# Patient Record
Sex: Female | Born: 2000 | Race: White | Hispanic: No | Marital: Single
Health system: Southern US, Community
[De-identification: ages and names within clinical notes are randomized; demographics above are authoritative.]

## PROBLEM LIST (undated history)

## (undated) DIAGNOSIS — Z9889 Other specified postprocedural states: Secondary | ICD-10-CM

## (undated) DIAGNOSIS — R112 Nausea with vomiting, unspecified: Secondary | ICD-10-CM

## (undated) DIAGNOSIS — R519 Headache, unspecified: Secondary | ICD-10-CM

## (undated) DIAGNOSIS — R262 Difficulty in walking, not elsewhere classified: Secondary | ICD-10-CM

## (undated) DIAGNOSIS — J45909 Unspecified asthma, uncomplicated: Secondary | ICD-10-CM

## (undated) DIAGNOSIS — N946 Dysmenorrhea, unspecified: Secondary | ICD-10-CM

## (undated) HISTORY — PX: TONSILLECTOMY: SUR1361

## (undated) HISTORY — DX: Unspecified asthma, uncomplicated: J45.909

## (undated) HISTORY — DX: Headache, unspecified: R51.9

---
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
## 2023-02-10 ENCOUNTER — Emergency Department
Admission: EM | Admit: 2023-02-10 | Discharge: 2023-02-11 | Disposition: A | Discharge status: Home / Self Care | Payer: BC Managed Care – PPO | Attending: Emergency Medicine | Admitting: Emergency Medicine

## 2023-02-10 ENCOUNTER — Emergency Department: Payer: BC Managed Care – PPO

## 2023-02-10 DIAGNOSIS — X501XXA Overexertion from prolonged static or awkward postures, initial encounter: Secondary | ICD-10-CM | POA: Insufficient documentation

## 2023-02-10 DIAGNOSIS — Y9368 Activity, volleyball (beach) (court): Secondary | ICD-10-CM | POA: Insufficient documentation

## 2023-02-10 DIAGNOSIS — S8390XA Sprain of unspecified site of unspecified knee, initial encounter: Secondary | ICD-10-CM

## 2023-02-10 DIAGNOSIS — S8391XA Sprain of unspecified site of right knee, initial encounter: Secondary | ICD-10-CM | POA: Insufficient documentation

## 2023-02-10 NOTE — ED Provider Notes (Signed)
APP SUPERVISORY AND SHARED VISIT NOTE:  I personally saw the patient and made/approved the management plan and take responsibility for the patient management.    Exam: RLE: +edema of R knee, no deformity/ecchymosis, pain with flexion/extension, no laxity, R ankle: non-tender, +from    MDM: sprain vs fx vs dislocation    Plan: x-rays, meds, referral to ortho    Interpretations:   O2 sat-           saturation: 97 %; Oxygen use: room air; Interpretation: Normal    Radiology -    x-ray neg for fx as interpreted by me.    Splint Application    Date/Time: 02/11/2023 1:28 AM    Performed by: Alcide Evener, MD  Authorized by: Alcide Evener, MD    Consent:     Consent obtained:  Verbal    Consent given by:  Patient    Risks discussed:  Pain and swelling    Alternatives discussed:  Referral  Universal protocol:     Imaging studies available: yes      Site/side marked: yes      Patient identity confirmed:  Hospital-assigned identification number  Pre-procedure details:     Distal neurologic exam:  Normal    Distal perfusion: distal pulses strong    Procedure details:     Location:  Knee    Knee location:  R knee    Splint type:  Knee immobilizer  Post-procedure details:     Procedure completion:  Tolerated         I personally discussed the patient's management with the physician assistant.       Alcide Evener, MD  02/11/23 [phone]

## 2023-02-10 NOTE — Discharge Instructions (Signed)
Dear Rebekah Barrera:    Thank you for choosing the [facility] Emergency Department, the premier emergency department in the [place] area.  I hope your visit today was EXCELLENT. You will receive a survey via text message that will give you the opportunity to provide feedback to your team about your visit. Please do not hesitate to reach out with any questions!    Specific instructions for your visit today:    Rest no strenuous activity.  Whitney immobilizer for support however it is important to remove knee immobilizer and move knee is much as possible.  Use crutches to avoid weightbearing.  No sports or strenuous activity for at least 1 week.  Follow-up with orthopedic specialist for reevaluation.  Return if worsening pain or if any other concerns or questions    IF YOU DO NOT CONTINUE TO IMPROVE OR YOUR CONDITION WORSENS, PLEASE CONTACT YOUR DOCTOR OR RETURN IMMEDIATELY TO THE EMERGENCY DEPARTMENT.    Sincerely,  No att. providers found  Attending Emergency Physician  [facility] Emergency Department      OBTAINING A PRIMARY CARE APPOINTMENT    Primary care physicians (PCPs, also known as primary care doctors) are either internists or family medicine doctors. Both types of PCPs focus on health promotion, disease prevention, patient education and counseling, and treatment of acute and chronic medical conditions.    If you need a primary care doctor, please call the below number and ask who is receiving new patients.     [facility]  Telephone:  [phone]  [email or web address]    DOCTOR REFERRALS  Call [phone] (available 24 hours a day, 7 days a week) if you need any further referrals and we can help you find a primary care doctor or specialist.  Also, available online at:  [email or web address]    YOUR CONTACT INFORMATION  Before leaving please check with registration to make sure we have an up-to-date contact number.  You can call registration at [phone] to  update your information.  For questions about your hospital bill, please call [phone].  For questions about your Emergency Dept Physician bill please call [phone].      FREE HEALTH SERVICES  If you need help with health or social services, please call 2-1-1 for a free referral to resources in your area.  2-1-1 is a free service connecting people with information on health insurance, free clinics, pregnancy, mental health, dental care, food assistance, housing, and substance abuse counseling.  Also, available online at:  http://www.[facility].org    ORTHOPEDIC INJURY   Please know that significant injuries can exist even when an initial x-ray is read as normal or negative.  This can occur because some fractures (broken bones) are not initially visible on x-rays.  For this reason, close outpatient follow-up with your primary care doctor or bone specialist (orthopedist) is required.    MEDICATIONS AND FOLLOWUP  Please be aware that some prescription medications can cause drowsiness.  Use caution when driving or operating machinery.    The examination and treatment you have received in our Emergency Department is provided on an emergency basis, and is not intended to be a substitute for your primary care physician.  It is important that your doctor checks you again and that you report any new or remaining problems at that time.      ASSISTANCE WITH INSURANCE    Affordable Care Act  [facility])  Call to start or finish an application, compare plans, enroll or ask a question.  [phone]  TTY: ([phone]  Web:  Healthcare.gov    Help Enrolling in [facility]  [employer] [place]  (878) 767-9076 (TOLL-FREE)  ([phone] 634-3730 (TTY)  Web:  Http://www.coverva.org    Local Help Enrolling in the [facility]  [place] [facility]  734-010-6298 (MAIN)  Email:  health-help@nvfs .org  Web:  BlackjackMyths.is  Address:  [address] [place], [place] [address]    SEDATING MEDICATIONS  Sedating medications  include strong pain medications (e.g. narcotics), muscle relaxers, benzodiazepines (used for anxiety and as muscle relaxers), Benadryl/diphenhydramine and other antihistamines for allergic reactions/itching, and other medications.  If you are unsure if you have received a sedating medication, please ask your physician or nurse.  If you received a sedating medication: DO NOT drive a car. DO NOT operate machinery. DO NOT perform jobs where you need to be alert.  DO NOT drink alcoholic beverages while taking this medicine.     If you get dizzy, sit or lie down at the first signs. Be careful going up and down stairs.  Be extra careful to prevent falls.     Never give this medicine to others.     Keep this medicine out of reach of children.     Do not take or save old medicines. Throw them away when outdated.     Keep all medicines in a cool, dry place. DO NOT keep them in your bathroom medicine cabinet or in a cabinet above the stove.    MEDICATION REFILLS  Please be aware that we cannot refill any prescriptions through the ER. If you need further treatment from what is provided at your ER visit, please follow up with your primary care doctor or your pain management specialist.    FREESTANDING EMERGENCY DEPARTMENTS OF [facility]  Did you know [facility] has two freestanding ERs located just a few miles away?  [facility] [place] and [facility] [place] have short wait times, easy free parking directly in front of the building and top patient satisfaction scores - and the same Board Certified Emergency Medicine doctors as [facility].

## 2023-02-10 NOTE — ED Triage Notes (Signed)
Patient reports having right knee twisted and felt excruciating pain to medial aspect of the right knee. Pain shot through the leg. Difficulty to weight bear. Similar pain when she injured left knee and torn ACL. Been putting ice to area. Denies injury or trauma to area. A&Ox4, breathing stable.

## 2023-02-10 NOTE — ED Provider Notes (Signed)
History     Chief Complaint   Patient presents with    Knee Injury     Patient with history of meniscal tears on bilateral knees history of ACL repair on the left knee.  Patient is a Horticulturist, commercial and a Customer service manager.  Today during volleyball game right foot was planted and knee shifted from side-to-side.  Sharp pain.  Unable to continue playing.  Complains of moderate pain.  No numbness tingling.  Notes pain similar to previous episode of ACL tear on the opposite side.  Past medical history: Asthma  Social history: No tobacco alcohol or drug use    The history is provided by the patient. No language interpreter was used.        Medical History[1]    Past Surgical History[2]    Family History[3]    Social  Social History[4]    .     Allergies[5]    Home Medications               APRI 0.15-30 MG-MCG per tablet     TAKE 1 TABLET BY MOUTH DAILY.     aspirin-acetaminophen-caffeine (EXCEDRIN MIGRAINE) 250-250-65 MG per tablet     Take 1 tablet by mouth every 6 (six) hours as needed.             Review of Systems   All other systems reviewed and are negative.      Physical Exam    BP: 134/83, Heart Rate: 89, Temp: 98.7 F (37.1 C), Resp Rate: 18, SpO2: 97 %, Weight: 90.7 kg    Physical Exam  Vitals and nursing note reviewed.   Constitutional:       General: She is not in acute distress.     Appearance: She is well-developed. She is not diaphoretic.   HENT:      Head: Normocephalic and atraumatic.      Right Ear: External ear normal.      Left Ear: External ear normal.      Nose: Nose normal.   Eyes:      General:         Right eye: No discharge.         Left eye: No discharge.      Conjunctiva/sclera: Conjunctivae normal.      Pupils: Pupils are equal, round, and reactive to light.   Neck:      Trachea: Phonation normal. No tracheal deviation.   Cardiovascular:      Rate and Rhythm: Normal rate and regular rhythm.      Pulses:           Radial pulses are 2+ on the right side and 2+ on the left side.   Pulmonary:       Effort: Pulmonary effort is normal. No respiratory distress.   Musculoskeletal:         General: Normal range of motion.      Cervical back: Normal range of motion and neck supple.      Comments: Right knee: No deformity, no visible swelling, diffuse tenderness along lateral knee.  Knee stable on valgus and varus force.  And anterior posterior drawer negative.  Extension intact.  Full range of motion but guarded due to pain.   Skin:     General: Skin is warm and dry.   Neurological:      Mental Status: She is alert and oriented to person, place, and time.      GCS: GCS eye subscore is 4.  GCS verbal subscore is 5. GCS motor subscore is 6.      Cranial Nerves: No cranial nerve deficit.   Psychiatric:         Speech: Speech normal.           MDM and ED Course     ED Medication Orders (From admission, onward)      None               Medical Decision Making  Patient presents with acute onset of right knee pain during a volleyball game.  Sharp pain.  History of prior knee injuries including ACL tear.  Knee stable on exam.  Requesting an MRI.  Spoke to patient at length.  Patient to follow-up with orthopedic specialist.  Knee immobilizer crutches given.      ----------------------------PROCEDURE: SPLINT APPLICATION-----------------------------------  Applied by tech, supervised by the emergency provider.  Location:R knee  Procedure: The area of the splint was appropriately positioned. A knee immobilizer and crutches was applied for definitive management.   Post-procedure: Good position. Neurovascular status remains intact. Patient tolerated the procedure well with no immediate complications.        Amount and/or Complexity of Data Reviewed  Radiology: ordered. Decision-making details documented in ED Course.     Details: Reviewed x-rays                     Procedures    Clinical Impression & Disposition     Clinical Impression  Final diagnoses:   Sprain of knee, unspecified laterality, unspecified ligament, initial  encounter        ED Disposition       None             New Prescriptions    No medications on file                     [1]   Past Medical History:  Diagnosis Date    Asthma     exercise induced as a child, no recent issues. no inhaler in years    Difficulty walking     limps    Headache     Post-operative nausea and vomiting     Grandmother   [2]   Past Surgical History:  Procedure Laterality Date    ARTHROSCOPIC KNEE, ACL RECONSTRUCTION Left 09/30/2017    Procedure: ARTHROSCOPIC KNEE, ACL RECONSTRUCTION;  Surgeon: Valera Castle, MD;  Location: [facility] OR;  Service: Orthopedics;  Laterality: Left;  LEFT KNEE ARTHROSCOPY, ACL RECONSTRUCTION W/BONE-PATTELLAR-BONE AUTOGRAFT; PARTIAL LATERAL MENISECTOMY    tonsillectomy and adnoids removed  2008   [3]   Family History  Problem Relation Name Age of Onset    No known problems Mother      No known problems Father     [4]   Social History  Tobacco Use    Smoking status: Never    Smokeless tobacco: Never   Vaping Use    Vaping status: Never Used   Substance Use Topics    Alcohol use: No    Drug use: No   [5] No Known Allergies       Tommi Rumps, PA  02/12/23 0410

## 2023-02-11 ENCOUNTER — Encounter (INDEPENDENT_AMBULATORY_CARE_PROVIDER_SITE_OTHER): Payer: Self-pay

## 2023-02-11 ENCOUNTER — Other Ambulatory Visit: Payer: Self-pay | Admitting: Nurse Practitioner

## 2023-02-11 DIAGNOSIS — M25561 Pain in right knee: Secondary | ICD-10-CM

## 2023-02-11 NOTE — ED Notes (Signed)
Knee immobilizer applied and educated on crutch use.

## 2023-02-12 ENCOUNTER — Ambulatory Visit (INDEPENDENT_AMBULATORY_CARE_PROVIDER_SITE_OTHER): Payer: BC Managed Care – PPO | Admitting: Sports Medicine

## 2023-02-12 ENCOUNTER — Encounter (INDEPENDENT_AMBULATORY_CARE_PROVIDER_SITE_OTHER): Payer: Self-pay | Admitting: Sports Medicine

## 2023-02-12 VITALS — Ht 69.0 in | Wt 200.0 lb

## 2023-02-12 DIAGNOSIS — M25561 Pain in right knee: Secondary | ICD-10-CM

## 2023-02-12 NOTE — Progress Notes (Signed)
[facility]    Provider: Valera Castle, MD  Date of Exam:  02/12/2023   Patient:  Rebekah Barrera  DOB:  30-May-2000    AGE:  22 y.o.  MR#:  [id number]     Chief Complaint: Right knee pain.    HPI:  Rebekah Barrera is a pleasant 22 y.o.-year-old female s/p L knee ACL reconstruction who presents today with a history of right knee pain that she has had for the past 2 days after a volleyball injury where she landed and felt her knee shift. She localizes the pain to the lateral knee. She reports she had immediate pain and swelling and was unable to bear weight on the right leg. She went to the ED,had negative xrays of the right knee  and was placed in an immobilizer. She reports she is feeling much better now and has no issues with ambulation, swelling resolved. She reports painless clicking at the knee. She reports swelling and stiffness post activities. She denies mechanical symptoms such as catching or locking. Rebekah Barrera denies a sense of instability.  She has no significant history of prior injury. She has attempted modifcation of her activities with little improvement in her symptoms. Her PCP has ordered her a right knee MRI which she has scheduled for next Friday. She has attempted 0 days of formal physical therapy.  Rebekah Barrera has been kindly referred to our clinic for further evaluation and discussion of treatment options.Rebekah Barrera is now here for further evaluation and discussion of treatment options.    For other past medical history, social history, family history and past surgical history please see them listed below.    Club/School/Work Affiliation: volleyball, dance     Problem List: Problem List[1]     Past Medical History: Medical History[2]    Social History: Social History[3]    Family History: Family History[4]    Past Surgical History: Past Surgical History[5]    Medications: Current Medications[6]    Allergies: Allergies[7]    ROS:  Constitutional: No fatigue, fever, weight loss, or weight gain.   Ears, Nose,  Mouth & Throat: No sore throat or hearing loss.   Cardiovascular: No chest pain, blood clots, or leg cramps.   Respiratory: No shortness of breath, cough, or difficulty breathing.   Gastrointestinal: No nausea, vomiting, diarrhea, or loss of appetite.   Genitourinary: No polyuria or kidney disease.   Musculoskeletal: No joint aches, muscle weakness or swelling of joints/body parts other than that mentioned above/below.  Integumentary: No finger nail changes or skin dryness.   Neurological: No numbness, burning discomfort, or headaches.   Psychiatric: No depression or anxiety.   Endocrine: No increased thirst, change in appetite or thyroid disease.   Hematologic/Lymphatic: No easy bruising or anemia.     EXAM:    Right Knee Exam:  Skin intact  Erythema: None present  Swelling: None present  Effusion: Present  Deformities: None  ROM: 0-135  Patellofemoral Crepitus: Negative  Patellar Apprehension at 30 deg: Negative  J Sign: Negative  Lachman 1A  Pivot Shift: Negative   Anterior Drawer: WNL  Posterior drawer: WNL  Dial Test at 30 WNL  at 90 WNL  Varus at 0 WNL at 30 WNL  Valgus at 0 WNL at 30 WNL  Medial McMurray's: Negative  Lateral McMurray's: Negative  Medial Joint Line TTP: Negative  Lateral Joint Line TTP: Positive  Strength: WNL    Other areas of Tenderness: None  DTR and Pathological Reflexes Intact  No  lymphadenopathy  Sensation Intact to light touch all distributions of leg and foot  DF, PF, EHL motor intact  Foot Perfused with CR <2 sec  DP/PT pulse 2+    Left Knee Exam:  Skin intact  Erythema: None present  Swelling: None present  Effusion: None  Deformities: None  ROM: 0-135  Patellofemoral Crepitus: Negative  Patellar Apprehension at 30 deg: Negative  J Sign: Negative  Lachman 1A  Pivot Shift: Negative   Anterior Drawer: WNL  Posterior drawer: WNL  Dial Test at 30 WNL  at 90 WNL  Varus at 0 WNL at 30 WNL  Valgus at 0 WNL at 30 WNL  Medial McMurray's: Negative  Lateral McMurray's: Negative  Medial Joint  Line TTP: None  Lateral Joint Line TTP: None  Strength: WNL    Other areas of Tenderness: None  DTR and Pathological Reflexes Intact  No lymphadenopathy  Sensation Intact to light touch all distributions of leg and foot  DF, PF, EHL motor intact  Foot Perfused with CR <2 sec  DP/PT pulse 2+    Vitals: Ht 1.753 m (5\' 9" )   Wt 90.7 kg (200 lb)   LMP 02/03/2023   BMI 29.53 kg/m     General: Rebekah Barrera was awake, alert, oriented, easily engaged, displayed logical thinking with clear speech and was neat in appearance. Her general appearance was normal, well-developed and well-nourished.    Gait: The patient demonstrated non antalgic gait with intact coordination and balance.     Studies:     Knee 4+ View Right [[id number]] (Order [id number])  Status: Final result     Study Result    Narrative & Impression   HISTORY: Pain of the right knee.     COMPARISON: 01/23/2016     FINDINGS:      No acute fracture or dislocation.     No focal soft tissue swelling.     No suprapatellar joint effusion.     IMPRESSION:      No acute abnormality.     Rebekah Isaacs, MD  02/11/2023 12:53 AM         Assessment/Plan: Rebekah Barrera is a pleasant 22 y.o. female with historical and clinical evidence of right knee injury, possible lateral meniscus injury.  The patient's diagnosis and treatment options were discussed in detail at today's visit. I have recommended an MRI for further evaluation and diagnosis.The patient will follow up in our clinic for review of the MRI at the next earliest available. I have taken this opportunity to refer the patient to formal physical therapy. I have asked the patient to avoid high impact activities and deep knee bent positions if possible.  We have discussed the potential need for surgical intervention. We will plan on follow up after the MRI.  The patient will notify my clinic of any changes or worsening of their symptoms during the interim.    Elements of MDM:   Based on the below 2023 Evaluation and Management Time  Requirements, the appropriate level of service is [phone] based on the 46 minutes spent face-to-face with the patient along with the non-face-to-face time spent by me performing the following highlighted activities:      Preparing for the visit (such as reviewing tests)  Getting and/or reviewing a history that was separately obtained  Performing the exam  Counseling and providing education to the patient, family, or caregiver  Documenting information in the medical record  Interpreting results and sharing that information with the patient, family or caregiver  Ordering medicines, tests, or procedures  Communicating with other healthcare professionals  Care coordination             The review of the patient's medications does not in any way constitute an endorsement, by this clinician,  of their use, dosage, indications, route, efficacy, interactions, or other clinical parameters.    This note may have been generated in part or in whole within the [employer] EMR using Dragon medical speech recognition software and may contain inherent errors or omissions not intended by the user. Grammatical and punctuation errors, random word insertions, deletions, pronoun errors and incomplete sentences are occasional consequences of this technology due to software limitations. Not all errors are caught or corrected.  Although every attempt is made to root out erroneus and incomplete transcription, the note may still not fully represent the intent or opinion of the author. If there are questions or concerns about the content of this note or information contained within the body of this dictation they should be addressed directly with the author for clarification.      I, Wynelle Beckmann, PA-C personally provided scribing services for Burnard Bunting, MD for patient SHELBYLYNN WALCZYK on February 12, 2023 .     Melody Burnetta Sabin, [place], acted as a Neurosurgeon for this encounter for Dr. Arsenio Loader. I have reviewed and edited this note when appropriate and agree with  the documentation            [1]   Patient Active Problem List  Diagnosis    Anterior cruciate ligament disruption, left, initial encounter    Complex tear of medial meniscus of left knee, initial encounter    Tear of lateral cartilage or meniscus of knee, current   [2]   Past Medical History:  Diagnosis Date    Asthma     exercise induced as a child, no recent issues. no inhaler in years    Difficulty walking     limps    Headache     Post-operative nausea and vomiting     Grandmother   [3]   Social History  Tobacco Use    Smoking status: Never    Smokeless tobacco: Never   Vaping Use    Vaping status: Never Used   Substance Use Topics    Alcohol use: No    Drug use: No   [4]   Family History  Problem Relation Name Age of Onset    No known problems Mother      No known problems Father     [5]   Past Surgical History:  Procedure Laterality Date    ARTHROSCOPIC KNEE, ACL RECONSTRUCTION Left 09/30/2017    Procedure: ARTHROSCOPIC KNEE, ACL RECONSTRUCTION;  Surgeon: Valera Castle, MD;  Location: [facility];  Service: Orthopedics;  Laterality: Left;  LEFT KNEE ARTHROSCOPY, ACL RECONSTRUCTION W/BONE-PATTELLAR-BONE AUTOGRAFT; PARTIAL LATERAL MENISECTOMY    tonsillectomy and adnoids removed  2008   [6]   Current Outpatient Medications:     APRI 0.15-30 MG-MCG per tablet, TAKE 1 TABLET BY MOUTH DAILY., Disp: , Rfl: 1    aspirin-acetaminophen-caffeine (EXCEDRIN MIGRAINE) 250-250-65 MG per tablet, Take 1 tablet by mouth every 6 (six) hours as needed, Disp: , Rfl:     cetirizine (ZyrTEC) 10 MG chewable tablet, Chew 1 tablet (10 mg) by mouth daily, Disp: , Rfl:   [7] No Known Allergies

## 2023-02-15 ENCOUNTER — Other Ambulatory Visit: Payer: Self-pay | Admitting: Nurse Practitioner

## 2023-02-15 DIAGNOSIS — M25561 Pain in right knee: Secondary | ICD-10-CM

## 2023-02-17 ENCOUNTER — Encounter (INDEPENDENT_AMBULATORY_CARE_PROVIDER_SITE_OTHER): Payer: Self-pay | Admitting: Sports Medicine

## 2023-02-17 ENCOUNTER — Ambulatory Visit (INDEPENDENT_AMBULATORY_CARE_PROVIDER_SITE_OTHER): Payer: BC Managed Care – PPO | Admitting: Sports Medicine

## 2023-02-17 ENCOUNTER — Ambulatory Visit: Payer: BC Managed Care – PPO | Attending: Nurse Practitioner

## 2023-02-17 VITALS — BP 137/77 | HR 76

## 2023-02-17 DIAGNOSIS — M25561 Pain in right knee: Secondary | ICD-10-CM | POA: Insufficient documentation

## 2023-02-17 DIAGNOSIS — M94261 Chondromalacia, right knee: Secondary | ICD-10-CM

## 2023-02-17 DIAGNOSIS — S83281A Other tear of lateral meniscus, current injury, right knee, initial encounter: Secondary | ICD-10-CM

## 2023-02-17 NOTE — Progress Notes (Signed)
[facility]    Provider: Valera Castle, MD  Date of Exam:  02/17/2023   Patient:  Rebekah Barrera  DOB:  [birth year]    AGE:  22 y.o.  MR#:  [id number]     Chief Complaint: Right knee pain.    HPI:  Rebekah Barrera is a pleasant 22 y.o.-year-old female s/p L knee ACL reconstruction who presents today with a history of right knee pain that she has had for the past 2 days after a volleyball injury where she landed and felt her knee shift. She localizes the pain to the lateral knee. She reports she had immediate pain and swelling and was unable to bear weight on the right leg. She went to the ED,had negative xrays of the right knee  and was placed in an immobilizer. She reports she is feeling much better now and has no issues with ambulation, swelling resolved. She reports painless clicking at the knee. She reports swelling and stiffness post activities. She denies mechanical symptoms such as catching or locking. Rebekah Barrera denies a sense of instability.  She has no significant history of prior injury. She has attempted modifcation of her activities with little improvement in her symptoms. Her PCP has ordered her a right knee MRI which she has scheduled for next Friday. She has attempted 0 days of formal physical therapy.  Rebekah Barrera has been kindly referred to our clinic for further evaluation and discussion of treatment options.Rebekah Barrera is now here for further evaluation and discussion of treatment options.      HPI update 02/17/23:  Patient presents today for MRI review.     For other past medical history, social history, family history and past surgical history please see them listed below.    Club/School/Work Affiliation: volleyball, dance     Problem List: Problem List[1]     Past Medical History: Medical History[2]    Social History: Social History[3]    Family History: Family History[4]    Past Surgical History: Past Surgical History[5]    Medications: Current Medications[6]    Allergies:  Allergies[7]    ROS:  Constitutional: No fatigue, fever, weight loss, or weight gain.   Ears, Nose, Mouth & Throat: No sore throat or hearing loss.   Cardiovascular: No chest pain, blood clots, or leg cramps.   Respiratory: No shortness of breath, cough, or difficulty breathing.   Gastrointestinal: No nausea, vomiting, diarrhea, or loss of appetite.   Genitourinary: No polyuria or kidney disease.   Musculoskeletal: No joint aches, muscle weakness or swelling of joints/body parts other than that mentioned above/below.  Integumentary: No finger nail changes or skin dryness.   Neurological: No numbness, burning discomfort, or headaches.   Psychiatric: No depression or anxiety.   Endocrine: No increased thirst, change in appetite or thyroid disease.   Hematologic/Lymphatic: No easy bruising or anemia.     EXAM:    Right Knee Exam:  Skin intact  Erythema: None present  Swelling: None present  Effusion: Present  Deformities: None  ROM: 0-135  Patellofemoral Crepitus: Negative  Patellar Apprehension at 30 deg: Negative  J Sign: Negative  Lachman 1A  Pivot Shift: Negative   Anterior Drawer: WNL  Posterior drawer: WNL  Dial Test at 30 WNL  at 90 WNL  Varus at 0 WNL at 30 WNL  Valgus at 0 WNL at 30 WNL  Medial McMurray's: Negative  Lateral McMurray's: Negative  Medial Joint Line TTP: Negative  Lateral Joint Line TTP: Positive  Strength: WNL  Other areas of Tenderness: None  DTR and Pathological Reflexes Intact  No lymphadenopathy  Sensation Intact to light touch all distributions of leg and foot  DF, PF, EHL motor intact  Foot Perfused with CR <2 sec  DP/PT pulse 2+    Left Knee Exam:  Skin intact  Erythema: None present  Swelling: None present  Effusion: None  Deformities: None  ROM: 0-135  Patellofemoral Crepitus: Negative  Patellar Apprehension at 30 deg: Negative  J Sign: Negative  Lachman 1A  Pivot Shift: Negative   Anterior Drawer: WNL  Posterior drawer: WNL  Dial Test at 30 WNL  at 90 WNL  Varus at 0 WNL at 30  WNL  Valgus at 0 WNL at 30 WNL  Medial McMurray's: Negative  Lateral McMurray's: Negative  Medial Joint Line TTP: None  Lateral Joint Line TTP: None  Strength: WNL    Other areas of Tenderness: None  DTR and Pathological Reflexes Intact  No lymphadenopathy  Sensation Intact to light touch all distributions of leg and foot  DF, PF, EHL motor intact  Foot Perfused with CR <2 sec  DP/PT pulse 2+    Vitals: BP 137/77   Pulse 76   LMP 02/03/2023     General: Rebekah Barrera was awake, alert, oriented, easily engaged, displayed logical thinking with clear speech and was neat in appearance. Her general appearance was normal, well-developed and well-nourished.    Gait: The patient demonstrated non antalgic gait with intact coordination and balance.     Studies:     Knee 4+ View Right [[id number]] (Order [id number])  Status: Final result     Study Result    Narrative & Impression   HISTORY: Pain of the right knee.     COMPARISON: 01/23/2016     FINDINGS:      No acute fracture or dislocation.     No focal soft tissue swelling.     No suprapatellar joint effusion.     IMPRESSION:      No acute abnormality.     Rebekah Isaacs, MD  02/11/2023 12:53 AM     MRI Knee Right  WO Contrast [[id number]] (Order [id number])  Status: Final result     Study Result    Narrative & Impression   HISTORY: Right knee pain     COMPARISON: X-rays taken on 02/10/2023.     TECHNIQUE: MRI of the right knee was performed on a 3T scanner without  intravenous contrast.     FINDINGS:  Menisci: There is longitudinal horizontal oblique tearing at the posterior  horn of the lateral meniscus. The medial meniscus remains intact.     Ligaments: The ACL and PCL are intact. The collateral ligaments appear  normal.     Muscles/Tendons: The extensor mechanism is intact. No abnormal muscle edema  or atrophy is seen.     Cartilage: There is chondral delamination and flap formation at the central  weightbearing regions of the lateral femoral condyle; the meniscal flap  spans approximately  9 mm x 4 mm. No other focal defects are identified.     Bone: Subchondral stress reaction seen at the medial femoral condyle in the  region of the aforementioned chondral defect.     Joint: There is a moderate-sized joint effusion. No loose body is  identified.     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  2.Moderate size joint effusion     Rebekah Feil, MD  02/17/2023 9:23 AM       Assessment/Plan: Rebekah Barrera is a pleasant 22 y.o. female with historical and clinical evidence of right knee injury lateral meniscal tear with chondral defect lateral femoral condyle with some bone marrow edema.  The patient's diagnosis and treatment options were discussed in detail at today's visit. I have had a discussion today with her regarding diagnosis.  Because she is on that the 1 her main activities are findings I do recommend right knee arthroscopy partial lateral meniscectomy versus repair and debridement of chondral lesion.  We talked about the options for the chondral lesion to include everything from simple chondroplasty debridement microfracture with bio cartilage to bigger procedures like oats.  After explaining risks and benefits of each they elected to proceed but do not want to proceed with any osteochondral allografting I did explain to her this is most likely be done in setting of a salvage procedure.  She understands that.  Risk benefits were explained.  In send risk benefits and like to proceed.  I have asked the patient to avoid high impact activities and deep knee bent positions if possible.  We have discussed the potential need for surgical intervention. We will plan on follow up for the surgery.  A brace was given today..  The patient will notify my clinic of any changes or worsening of their symptoms during the interim.    Elements of MDM:   Based on the below 2023  Evaluation and Management Time Requirements, the appropriate level of service is [phone] based on the 41 minutes spent face-to-face with the patient along with the non-face-to-face time spent by me performing the following highlighted activities:      Preparing for the visit (such as reviewing tests)  Getting and/or reviewing a history that was separately obtained  Performing the exam  Counseling and providing education to the patient, family, or caregiver  Documenting information in the medical record  Interpreting results and sharing that information with the patient, family or caregiver  Reviewing the risks of surgery   Surgical scheduling, coding and posting  Planning post-operative care (arranging follow up visits, PT prescriptions, ice machine, bracing, reviewing protocols, work notes)  Ordering medicines, tests, or procedures  Communicating with other healthcare professionals  Care coordination             I, Rebekah Burnetta Sabin, PA-C personally provided scribing services for Burnard Bunting, MD for patient MANAAL MANDALA on February 17, 2023 .     Rebekah Barrera, [place], acted as a Neurosurgeon for this encounter for Dr. Arsenio Loader. I have reviewed and edited this note when appropriate and agree with the documentation            [1]   Patient Active Problem List  Diagnosis    Anterior cruciate ligament disruption, left, initial encounter    Complex tear of medial meniscus of left knee, initial encounter    Tear of lateral cartilage or meniscus of knee, current   [2]   Past Medical History:  Diagnosis Date    Asthma     exercise induced as a child, no recent issues. no inhaler in years    Difficulty walking     limps    Headache     Post-operative nausea and vomiting     Grandmother   [3]   Social History  Tobacco Use    Smoking status: Never  Smokeless tobacco: Never   Vaping Use    Vaping status: Never Used   Substance Use Topics    Alcohol use: No    Drug use: No   [4]   Family History  Problem Relation Name Age of Onset    No  known problems Mother      No known problems Father     [5]   Past Surgical History:  Procedure Laterality Date    ARTHROSCOPIC KNEE, ACL RECONSTRUCTION Left 09/30/2017    Procedure: ARTHROSCOPIC KNEE, ACL RECONSTRUCTION;  Surgeon: Valera Castle, MD;  Location: [facility];  Service: Orthopedics;  Laterality: Left;  LEFT KNEE ARTHROSCOPY, ACL RECONSTRUCTION W/BONE-PATTELLAR-BONE AUTOGRAFT; PARTIAL LATERAL MENISECTOMY    tonsillectomy and adnoids removed  2008   [6]   Current Outpatient Medications:     aspirin-acetaminophen-caffeine (EXCEDRIN MIGRAINE) 250-250-65 MG per tablet, Take 1 tablet by mouth every 6 (six) hours as needed, Disp: , Rfl:     APRI 0.15-30 MG-MCG per tablet, TAKE 1 TABLET BY MOUTH DAILY. (Patient not taking: Reported on 02/17/2023), Disp: , Rfl: 1    cetirizine (ZyrTEC) 10 MG chewable tablet, Chew 1 tablet (10 mg) by mouth daily (Patient not taking: Reported on 02/17/2023), Disp: , Rfl:   [7] No Known Allergies

## 2023-02-18 ENCOUNTER — Encounter (INDEPENDENT_AMBULATORY_CARE_PROVIDER_SITE_OTHER): Payer: Self-pay | Admitting: Sports Medicine

## 2023-02-19 ENCOUNTER — Ambulatory Visit: Payer: BC Managed Care – PPO

## 2023-02-22 ENCOUNTER — Ambulatory Visit (INDEPENDENT_AMBULATORY_CARE_PROVIDER_SITE_OTHER): Payer: BC Managed Care – PPO | Admitting: Sports Medicine

## 2023-02-23 ENCOUNTER — Encounter (INDEPENDENT_AMBULATORY_CARE_PROVIDER_SITE_OTHER): Payer: Self-pay

## 2023-03-05 ENCOUNTER — Encounter (INDEPENDENT_AMBULATORY_CARE_PROVIDER_SITE_OTHER): Payer: Self-pay

## 2023-03-09 ENCOUNTER — Encounter (INDEPENDENT_AMBULATORY_CARE_PROVIDER_SITE_OTHER): Payer: Self-pay | Admitting: Sports Medicine

## 2023-03-09 ENCOUNTER — Ambulatory Visit (AMBULATORY_SURGERY_CENTER): Payer: BC Managed Care – PPO | Admitting: Sports Medicine

## 2023-03-09 ENCOUNTER — Encounter (INDEPENDENT_AMBULATORY_CARE_PROVIDER_SITE_OTHER): Payer: Self-pay

## 2023-03-09 DIAGNOSIS — M958 Other specified acquired deformities of musculoskeletal system: Secondary | ICD-10-CM

## 2023-03-09 DIAGNOSIS — S83281A Other tear of lateral meniscus, current injury, right knee, initial encounter: Secondary | ICD-10-CM

## 2023-03-09 MED ORDER — ONDANSETRON HCL 4 MG PO TABS
4.0000 mg | ORAL_TABLET | Freq: Three times a day (TID) | ORAL | 0 refills | Status: DC | PRN
Start: 2023-03-09 — End: 2023-06-04

## 2023-03-09 MED ORDER — OXYCODONE HCL 5 MG PO TABS
5.0000 mg | ORAL_TABLET | ORAL | 0 refills | Status: AC | PRN
Start: 2023-03-09 — End: 2023-03-21

## 2023-03-10 ENCOUNTER — Encounter (INDEPENDENT_AMBULATORY_CARE_PROVIDER_SITE_OTHER): Payer: Self-pay | Admitting: Sports Medicine

## 2023-03-13 ENCOUNTER — Encounter (INDEPENDENT_AMBULATORY_CARE_PROVIDER_SITE_OTHER): Payer: Self-pay | Admitting: Sports Medicine

## 2023-03-17 ENCOUNTER — Inpatient Hospital Stay: Payer: BC Managed Care – PPO

## 2023-03-19 ENCOUNTER — Encounter: Payer: Self-pay | Admitting: Rehabilitative and Restorative Service Providers"

## 2023-03-19 ENCOUNTER — Inpatient Hospital Stay: Payer: BC Managed Care – PPO | Attending: Sports Medicine | Admitting: Rehabilitative and Restorative Service Providers"

## 2023-03-19 VITALS — BP 135/87

## 2023-03-19 DIAGNOSIS — M25561 Pain in right knee: Secondary | ICD-10-CM | POA: Insufficient documentation

## 2023-03-19 DIAGNOSIS — R262 Difficulty in walking, not elsewhere classified: Secondary | ICD-10-CM | POA: Insufficient documentation

## 2023-03-19 NOTE — Progress Notes (Signed)
 Name:Rebekah Barrera Age: 22 y.o.   Date of Service: 03/19/2023  Referring Physician: Valera Castle, MD   Date of Injury: No data found 03/18/2023  PT Date Care Plan Established/Reviewed:03/19/2023  PT Date Treatment Started:03/19/2023  OT Date Care Plan

## 2023-03-23 ENCOUNTER — Telehealth (INDEPENDENT_AMBULATORY_CARE_PROVIDER_SITE_OTHER): Payer: Self-pay

## 2023-03-23 ENCOUNTER — Inpatient Hospital Stay: Payer: BC Managed Care – PPO | Admitting: Rehabilitative and Restorative Service Providers"

## 2023-03-23 NOTE — Telephone Encounter (Signed)
Op report sent to [facility] at [phone].

## 2023-03-24 ENCOUNTER — Ambulatory Visit (INDEPENDENT_AMBULATORY_CARE_PROVIDER_SITE_OTHER): Payer: BC Managed Care – PPO | Admitting: Sports Medicine

## 2023-03-24 ENCOUNTER — Encounter (INDEPENDENT_AMBULATORY_CARE_PROVIDER_SITE_OTHER): Payer: Self-pay | Admitting: Sports Medicine

## 2023-03-24 VITALS — BP 127/80 | HR 81 | Ht 69.0 in | Wt 200.0 lb

## 2023-03-24 DIAGNOSIS — Z9889 Other specified postprocedural states: Secondary | ICD-10-CM

## 2023-03-24 NOTE — Progress Notes (Signed)
 [facility]    Provider: Valera Castle, MD  Date of Exam:  03/24/2023   Patient:  Rebekah Barrera  DOB:  2000-08-15    AGE:  22 y.o.  MR#:  [id number]     Diagnosis: s/p right knee arthroscopy with partial lateral menisectomy, chondroplasty and mic

## 2023-03-26 ENCOUNTER — Inpatient Hospital Stay: Payer: BC Managed Care – PPO | Admitting: Rehabilitative and Restorative Service Providers"

## 2023-03-30 ENCOUNTER — Inpatient Hospital Stay: Payer: BC Managed Care – PPO | Admitting: Rehabilitative and Restorative Service Providers"

## 2023-04-06 ENCOUNTER — Inpatient Hospital Stay: Payer: BC Managed Care – PPO | Admitting: Rehabilitative and Restorative Service Providers"

## 2023-04-09 ENCOUNTER — Inpatient Hospital Stay: Payer: BC Managed Care – PPO | Admitting: Rehabilitative and Restorative Service Providers"

## 2023-04-13 ENCOUNTER — Inpatient Hospital Stay: Payer: BC Managed Care – PPO

## 2023-04-16 ENCOUNTER — Inpatient Hospital Stay: Payer: BC Managed Care – PPO

## 2023-04-20 ENCOUNTER — Inpatient Hospital Stay: Payer: BC Managed Care – PPO | Admitting: Rehabilitative and Restorative Service Providers"

## 2023-04-21 ENCOUNTER — Ambulatory Visit (INDEPENDENT_AMBULATORY_CARE_PROVIDER_SITE_OTHER): Payer: BC Managed Care – PPO | Admitting: Sports Medicine

## 2023-04-21 VITALS — BP 129/79 | HR 86

## 2023-04-21 DIAGNOSIS — M25561 Pain in right knee: Secondary | ICD-10-CM

## 2023-04-21 NOTE — Progress Notes (Signed)
 [facility]    Provider: Valera Castle, MD  Date of Exam:  04/21/2023   Patient:  Rebekah Barrera  DOB:  Jan 26, 2001    AGE:  22 y.o.  MR#:  [id number]     Diagnosis: s/p right knee arthroscopy with partial lateral menisectomy, chondroplasty and mic

## 2023-04-30 ENCOUNTER — Ambulatory Visit (INDEPENDENT_AMBULATORY_CARE_PROVIDER_SITE_OTHER): Payer: BC Managed Care – PPO | Admitting: Sports Medicine

## 2023-06-04 ENCOUNTER — Ambulatory Visit (INDEPENDENT_AMBULATORY_CARE_PROVIDER_SITE_OTHER): Payer: BC Managed Care – PPO | Admitting: Sports Medicine

## 2023-06-04 ENCOUNTER — Encounter (INDEPENDENT_AMBULATORY_CARE_PROVIDER_SITE_OTHER): Payer: Self-pay | Admitting: Sports Medicine

## 2023-06-04 VITALS — Ht 69.0 in | Wt 200.0 lb

## 2023-06-04 DIAGNOSIS — Z9889 Other specified postprocedural states: Secondary | ICD-10-CM

## 2023-06-04 NOTE — Progress Notes (Signed)
 [facility]    Provider: Lamar KANDICE Riches, MD  Date of Exam:  06/04/2023   Patient:  Rebekah Barrera  DOB:  July 31, 2000    AGE:  23 y.o.  MR#:  [id number]     Diagnosis: s/p right knee arthroscopy with partial lateral menisectomy, chondroplasty and microfracture of lateral femoral condyle defect (DOS 03/09/23)    HPI: Jazmene returns, 12 weeks status post right knee surgery as described above. She has been compliant with all post-operative instructions to date. She has started and been attending formal physical therapy as previously prescribed. Hermione is here today for her regularly scheduled post-operative visit.she is without complaint, no pain. She does get occasional soreness over the anterior aspect of her knee with any twisting activity.  She has been in physical therapy but she states that they wanted to discharge her.    For other past medical history, social history, family history and past surgical history please see them listed below.    Problem List: Problem List[1]    Past Medical History:  Medical History[2]    Social History: Social History[3]    Family History: Family History[4]    Past Surgical History:  Past Surgical History[5]    Medications:  Scheduled Meds:  Continuous Infusions:  PRN Meds:.     Allergies:  Allergies[6]    ROS:  Constitutional: No fatigue, fever, weight loss, or weight gain.   Ears, Nose, Mouth & Throat: No sore throat or hearing loss.   Cardiovascular: No chest pain, blood clots, or leg cramps.   Respiratory: No shortness of breath, cough, or difficulty breathing.   Gastrointestinal: No nausea, vomiting, diarrhea, or loss of appetite.   Genitourinary: No polyuria or kidney disease.   Musculoskeletal: No joint aches, muscle weakness or swelling of joints/body parts other than that mentioned above.  Integumentary: No finger nail changes or skin dryness.   Neurological: No numbness, burning discomfort, or headaches.   Psychiatric: No depression or anxiety.   Endocrine: No increased  thirst, change in appetite or thyroid disease.   Hematologic/Lymphatic: No easy bruising or anemia.     Exam:   Right knee Exam:  Incisions are well-healed, clean, dry and intact. No erythema, edema, induration, drainage, warmth or evidence of infection   No pain with passive flexion   Calf soft, nttp  Negative Homan's  No pain with passive flexion   Range of motion 0-1 35 without pain  No significant effusion.  No tenderness to palpation.   Straight leg raise is intact.  Mild quadriceps atrophy.  Lachman is 1A  Negative pivot shift.  Stable anterior drawer.  Stable varus and valgus at 0 and 30 degrees  No other areas of tenderness.  DTR and Pathological Reflexes Intact  No lymphadenopathy  Sensation Intact to light touch all distributions of leg and foot  DF, PF, EHL motor intact  Foot Perfused with CR <2 sec  DP/PT pulse 2+    Vitals: Ht 1.753 m (5' 9)   Wt 90.7 kg (200 lb)   BMI 29.53 kg/m     General: Elizbeth was awake, alert, oriented, easily engaged, displayed logical thinking with clear speech and was neat in appearance. Her general appearance was normal, well-developed and well-nourished.    Gait: The patient demonstrated non antalgic gait with use of crutches    Studies: No new studies obtained on today's visit.     Assessment/Plan: She is doing well.. The patient will continue physical therapy and therapeutic exercises per  protocol.  Do think she needs more physical therapy patient was instructed on the dos and don'ts regarding protection of the surgical procedure, as well as appropriate activity levels going forward. Mega understands the importance of a dedicated therapy program. The patient will notify my clinic of any changes or worsening of their symptoms during the interim. We will plan on follow up in 6 weeks she will try stool softeners  Chenise and all the patient's questions were answered appropriately and completely. Xee understands the plan.     The review of the patient's medications does not in  any way constitute an endorsement, by this clinician,  of their use, dosage, indications, route, efficacy, interactions, or other clinical parameters.    This note may have been generated in part or in whole within the EPIC EMR using Dragon medical speech recognition software and may contain inherent errors or omissions not intended by the user. Grammatical and punctuation errors, random word insertions, deletions, pronoun errors and incomplete sentences are occasional consequences of this technology due to software limitations. Not all errors are caught or corrected.  Although every attempt is made to root out erroneus and incomplete transcription, the note may still not fully represent the intent or opinion of the author. If there are questions or concerns about the content of this note or information contained within the body of this dictation they should be addressed directly with the author for clarification.               [1]   Patient Active Problem List  Diagnosis    Anterior cruciate ligament disruption, left, initial encounter    Complex tear of medial meniscus of left knee, initial encounter    Tear of lateral cartilage or meniscus of knee, current   [2]   Past Medical History:  Diagnosis Date    Asthma     exercise induced as a child, no recent issues. no inhaler in years    Difficulty walking     limps    Headache     Post-operative nausea and vomiting     Grandmother   [3]   Social History  Tobacco Use    Smoking status: Never    Smokeless tobacco: Never   Vaping Use    Vaping status: Never Used   Substance Use Topics    Alcohol use: No    Drug use: No   [4]   Family History  Problem Relation Name Age of Onset    No known problems Mother      No known problems Father     [5]   Past Surgical History:  Procedure Laterality Date    ARTHROSCOPIC ASSISTED, KNEE, ANTERIOR CRUCIATE LIGAMENT (ACL) RECONSTRUCTION, ALLOGRAFT Left 09/30/2017    Procedure: ARTHROSCOPIC KNEE, ACL RECONSTRUCTION;  Surgeon: Verdis Lamar MATSU, MD;  Location: [facility];  Service: Orthopedics;  Laterality: Left;  LEFT KNEE ARTHROSCOPY, ACL RECONSTRUCTION W/BONE-PATTELLAR-BONE AUTOGRAFT; PARTIAL LATERAL MENISECTOMY    tonsillectomy and adnoids removed  2008   [6] No Known Allergies

## 2023-06-04 NOTE — PT/OT Therapy Note (Signed)
 Name: Rebekah Barrera Age: 23 y.o.   Date of Service: 03/19/2023  Referring Physician: Verdis Lamar MATSU, MD   Date of Injury: No data found 03/18/2023  PT Date Care Plan Established/Reviewed:03/19/2023  PT Date Treatment Started:03/19/2023  OT Date Care Plan Established/Reviewed: No data found  OT Date Treatment Started: No data found    (Historic) Date of Injury:No data was found  (Historic) Date Care Plan Established/Reviewed No data was found  No data was found  (Historic) Date Treatment Started No data was found No data was found    End of Certification Date: 06/16/2023  Sessions in Plan of Care: 20  Surgery Date: 03/09/2023  MD Follow-up: 03/24/2023  Medbridge Code: [id number]    Visit Count: 1   Diagnosis:    Diagnosis ICD-10-CM Associated Order   1. Acute pain of right knee  M25.561 Physical Therapy Plan of Care      2. Difficulty in walking, not elsewhere classified  R26.2 Physical Therapy Plan of Care                         Precautions: No data was found  Allergies: Patient has no known allergies.        BP: 135/87              ---      Flowsheet Row ---   Total Time    Timed Minutes 23 minutes   Untimed Minutes 25 minutes   Total Time 48 minutes                 Dairl GORMAN Chain, DPT      Parts of this note were generated by the Epic EMR system/Dragon speech recognition and may contain inherent errors or omissions not intended by the user. Grammatical errors, random word insertions, deletions, pronoun errors and incomplete sentences are occasional consequences of this technology due to software limitations. Not all errors are caught or corrected.  If there are questions or concerns about the content of this note or information contained within the body of this dictation they should be addressed directly with the author for clarification.

## 2023-06-16 ENCOUNTER — Encounter (INDEPENDENT_AMBULATORY_CARE_PROVIDER_SITE_OTHER): Payer: Self-pay | Admitting: Sports Medicine

## 2023-07-21 ENCOUNTER — Encounter (INDEPENDENT_AMBULATORY_CARE_PROVIDER_SITE_OTHER): Payer: Self-pay | Admitting: Sports Medicine

## 2023-07-22 ENCOUNTER — Other Ambulatory Visit (INDEPENDENT_AMBULATORY_CARE_PROVIDER_SITE_OTHER): Payer: Self-pay

## 2023-07-22 DIAGNOSIS — S83281A Other tear of lateral meniscus, current injury, right knee, initial encounter: Secondary | ICD-10-CM

## 2023-07-22 DIAGNOSIS — Z9889 Other specified postprocedural states: Secondary | ICD-10-CM

## 2023-08-06 ENCOUNTER — Ambulatory Visit (INDEPENDENT_AMBULATORY_CARE_PROVIDER_SITE_OTHER): Payer: BC Managed Care – PPO | Admitting: Sports Medicine

## 2023-08-06 ENCOUNTER — Encounter (INDEPENDENT_AMBULATORY_CARE_PROVIDER_SITE_OTHER): Payer: Self-pay | Admitting: Sports Medicine

## 2023-08-06 VITALS — BP 129/83 | HR 90 | Ht 69.0 in | Wt 200.0 lb

## 2023-08-06 DIAGNOSIS — S83281A Other tear of lateral meniscus, current injury, right knee, initial encounter: Secondary | ICD-10-CM

## 2023-08-06 NOTE — Progress Notes (Signed)
 [facility]    Provider: Lamar KANDICE Riches, MD  Date of Exam:  08/06/2023   Patient:  Rebekah Barrera  DOB:  2000/09/20    AGE:  23 y.o.  MR#:  [id number]     Diagnosis: s/p right knee arthroscopy with partial lateral menisectomy, chondroplasty and microfracture of lateral femoral condyle defect (DOS 03/09/23)    HPI: Rebekah Barrera returns, 5 months status post right knee surgery as described above. She has been compliant with all post-operative instructions to date. She has started and been attending formal physical therapy as previously prescribed. Rebekah Barrera is here today for her regularly scheduled post-operative visit.she is without complaint, no pain. She does get occasional soreness over the anterior aspect of her knee with any twisting activity.  She has been in physical therapy but she states that they wanted to discharge her.  She is going once every other week.  She gets occasional discomfort over her lateral side with jumping but she is return to dance and doing well with that.    For other past medical history, social history, family history and past surgical history please see them listed below.    Problem List: Problem List[1]    Past Medical History:  Medical History[2]    Social History: Social History[3]    Family History: Family History[4]    Past Surgical History:  Past Surgical History[5]    Medications:  Scheduled Meds:  Continuous Infusions:  PRN Meds:.     Allergies:  Allergies[6]    ROS:  Constitutional: No fatigue, fever, weight loss, or weight gain.   Ears, Nose, Mouth & Throat: No sore throat or hearing loss.   Cardiovascular: No chest pain, blood clots, or leg cramps.   Respiratory: No shortness of breath, cough, or difficulty breathing.   Gastrointestinal: No nausea, vomiting, diarrhea, or loss of appetite.   Genitourinary: No polyuria or kidney disease.   Musculoskeletal: No joint aches, muscle weakness or swelling of joints/body parts other than that mentioned above.  Integumentary: No finger nail  changes or skin dryness.   Neurological: No numbness, burning discomfort, or headaches.   Psychiatric: No depression or anxiety.   Endocrine: No increased thirst, change in appetite or thyroid disease.   Hematologic/Lymphatic: No easy bruising or anemia.     Exam:   Right knee Exam:  Incisions are well-healed,   No pain with passive flexion   Calf soft, nttp  Negative Homan's  No effusion  No pain with passive flexion   Range of motion 0-1 35 without pain  No significant effusion.  No tenderness to palpation.   Straight leg raise is intact.  Mild quadriceps atrophy.  Lachman is 1A  Negative pivot shift.  Stable anterior drawer.  Stable varus and valgus at 0 and 30 degrees  No other areas of tenderness.  DTR and Pathological Reflexes Intact  No lymphadenopathy  Sensation Intact to light touch all distributions of leg and foot  DF, PF, EHL motor intact  Foot Perfused with CR <2 sec  DP/PT pulse 2+    Vitals: BP 129/83   Pulse 90   Ht 1.753 m (5' 9)   Wt 90.7 kg (200 lb)   BMI 29.53 kg/m     General: Rebekah Barrera was awake, alert, oriented, easily engaged, displayed logical thinking with clear speech and was neat in appearance. Her general appearance was normal, well-developed and well-nourished.    Gait: The patient demonstrated non antalgic gait with use of crutches  Studies: No new studies obtained on today's visit.     Assessment/Plan: She is doing well.. The patient will continue physical therapy and therapeutic exercises per protocol.  Do think she needs more physical therapy patient was instructed on the dos and don'ts regarding protection of the surgical procedure, as well as appropriate activity levels going forward.  I do think chondral injury is probably giving her some of the discomfort I think will improve with strengthening.  Rebekah Barrera understands the importance of a dedicated therapy program. The patient will notify my clinic of any changes or worsening of their symptoms during the interim. We will plan on  follow up in 8 weeks she will try stool softeners  Rebekah Barrera and all the patient's questions were answered appropriately and completely. Rebekah Barrera understands the plan.     Based on the below 2023 Evaluation and Management Time Requirements, the appropriate level of service is 99213 based on the 22 minutes spent face-to-face with the patient along with the non-face-to-face time spent by me performing the following highlighted activities:      Preparing for the visit (such as reviewing tests)  Getting and/or reviewing a history that was separately obtained  Performing the exam  Counseling and providing education to the patient, family, or caregiver  Documenting information in the medical record  Interpreting results and sharing that information with the patient, family or caregiver  Ordering medicines, tests, or procedures  Communicating with other healthcare professionals  Care coordination             The review of the patient's medications does not in any way constitute an endorsement, by this clinician,  of their use, dosage, indications, route, efficacy, interactions, or other clinical parameters.    This note may have been generated in part or in whole within the [employer] EMR using Dragon medical speech recognition software and may contain inherent errors or omissions not intended by the user. Grammatical and punctuation errors, random word insertions, deletions, pronoun errors and incomplete sentences are occasional consequences of this technology due to software limitations. Not all errors are caught or corrected.  Although every attempt is made to root out erroneus and incomplete transcription, the note may still not fully represent the intent or opinion of the author. If there are questions or concerns about the content of this note or information contained within the body of this dictation they should be addressed directly with the author for clarification.               [1]   Patient Active Problem List  Diagnosis     Anterior cruciate ligament disruption, left, initial encounter    Complex tear of medial meniscus of left knee, initial encounter    Tear of lateral cartilage or meniscus of knee, current   [2]   Past Medical History:  Diagnosis Date    Asthma     exercise induced as a child, no recent issues. no inhaler in years    Difficulty walking     limps    Headache     Post-operative nausea and vomiting     Grandmother   [3]   Social History  Tobacco Use    Smoking status: Never    Smokeless tobacco: Never   Vaping Use    Vaping status: Never Used   Substance Use Topics    Alcohol use: No    Drug use: No   [4]   Family History  Problem Relation Name Age of Onset  No known problems Mother      No known problems Father     [5]   Past Surgical History:  Procedure Laterality Date    ARTHROSCOPIC ASSISTED, KNEE, ANTERIOR CRUCIATE LIGAMENT (ACL) RECONSTRUCTION, ALLOGRAFT Left 09/30/2017    Procedure: ARTHROSCOPIC KNEE, ACL RECONSTRUCTION;  Surgeon: Verdis Lamar MATSU, MD;  Location: [facility];  Service: Orthopedics;  Laterality: Left;  LEFT KNEE ARTHROSCOPY, ACL RECONSTRUCTION W/BONE-PATTELLAR-BONE AUTOGRAFT; PARTIAL LATERAL MENISECTOMY    tonsillectomy and adnoids removed  2008   [6] No Known Allergies

## 2023-08-16 ENCOUNTER — Encounter (INDEPENDENT_AMBULATORY_CARE_PROVIDER_SITE_OTHER): Payer: Self-pay | Admitting: Sports Medicine

## 2023-10-08 ENCOUNTER — Encounter (INDEPENDENT_AMBULATORY_CARE_PROVIDER_SITE_OTHER): Payer: Self-pay | Admitting: Sports Medicine

## 2023-10-08 ENCOUNTER — Ambulatory Visit (INDEPENDENT_AMBULATORY_CARE_PROVIDER_SITE_OTHER): Admitting: Sports Medicine

## 2023-10-08 VITALS — BP 116/69 | HR 72 | Ht 69.0 in | Wt 200.0 lb

## 2023-10-08 DIAGNOSIS — S83281A Other tear of lateral meniscus, current injury, right knee, initial encounter: Secondary | ICD-10-CM

## 2023-10-08 DIAGNOSIS — Z9889 Other specified postprocedural states: Secondary | ICD-10-CM

## 2023-10-08 MED ORDER — DICLOFENAC SODIUM 75 MG PO TBEC
75.0000 mg | DELAYED_RELEASE_TABLET | Freq: Two times a day (BID) | ORAL | 2 refills | Status: AC
Start: 2023-10-08 — End: 2023-11-13

## 2023-10-08 NOTE — Progress Notes (Signed)
 [facility]    Provider: Lamar KANDICE Riches, MD  Date of Exam:  10/08/2023   Patient:  Rebekah Barrera  DOB:  [birth year]    AGE:  23 y.o.  MR#:  [id number]     Diagnosis: s/p right knee arthroscopy with partial lateral menisectomy, chondroplasty and microfracture of lateral femoral condyle defect (DOS 03/09/23)    HPI: Rebekah Barrera returns, 7 months status post right knee surgery as described above. She has been compliant with all post-operative instructions to date.  She has returned to dance/theatre, and reports that she has been performing without restriction, but does notice pain laterally with impact loading/jumping. She has attended formal physical therapy as previously prescribed and been did discharged. Rebekah Barrera is here today for her regularly scheduled post-operative visit.    For other past medical history, social history, family history and past surgical history please see them listed below.    Problem List: Problem List[1]    Past Medical History:  Medical History[2]    Social History: Social History[3]    Family History: Family History[4]    Past Surgical History:  Past Surgical History[5]    Medications:  Scheduled Meds:  Continuous Infusions:  PRN Meds:.     Allergies:  Allergies[6]    ROS:  Constitutional: No fatigue, fever, weight loss, or weight gain.   Ears, Nose, Mouth & Throat: No sore throat or hearing loss.   Cardiovascular: No chest pain, blood clots, or leg cramps.   Respiratory: No shortness of breath, cough, or difficulty breathing.   Gastrointestinal: No nausea, vomiting, diarrhea, or loss of appetite.   Genitourinary: No polyuria or kidney disease.   Musculoskeletal: No joint aches, muscle weakness or swelling of joints/body parts other than that mentioned above.  Integumentary: No finger nail changes or skin dryness.   Neurological: No numbness, burning discomfort, or headaches.   Psychiatric: No depression or anxiety.   Endocrine: No increased thirst, change in appetite or thyroid disease.    Hematologic/Lymphatic: No easy bruising or anemia.     Exam:   Right knee Exam:  Incisions are well-healed,   No pain with passive flexion   Calf soft, nttp  Negative Homan's  No effusion  No pain with passive flexion   Range of motion -10-0-135 without pain  No significant effusion.  No tenderness to palpation.   Straight leg raise is intact.  Mild quadriceps atrophy.  Lachman is 1A  Negative pivot shift.  Stable anterior drawer.  Stable varus and valgus at 0 and 30 degrees  No other areas of tenderness.  DTR and Pathological Reflexes Intact  No lymphadenopathy  Sensation Intact to light touch all distributions of leg and foot  DF, PF, EHL motor intact  Foot Perfused with CR <2 sec  DP/PT pulse 2+    Vitals: BP 116/69   Pulse 72   Ht 1.753 m (5' 9)   Wt 90.7 kg (200 lb)   BMI 29.53 kg/m     General: Rebekah Barrera was awake, alert, oriented, easily engaged, displayed logical thinking with clear speech and was neat in appearance. Her general appearance was normal, well-developed and well-nourished.    Gait: The patient demonstrated non antalgic gait with use of crutches    Studies: No new studies obtained on today's visit.     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 She was instructed on the dos and don'ts regarding protection of the surgical procedure, as well as appropriate activity levels going forward.  I do think chondral injury is probably giving her some of the discomfort I think will improve with strengthening.  Rebekah Barrera understands the importance of a dedicated therapy program. The patient will notify my clinic of any changes or worsening of their symptoms during the interim. We will plan on follow up in 8 weeks she will try stool softeners  Rebekah Barrera and all the patient's questions were answered appropriately and completely. Rebekah Barrera understands the plan.     Based on the below 2023 Evaluation and Management Time Requirements, the appropriate level of service is 99213 based  on the 22 minutes spent face-to-face with the patient along with the non-face-to-face time spent by me performing the following highlighted activities:      Preparing for the visit (such as reviewing tests)  Getting and/or reviewing a history that was separately obtained  Performing the exam  Counseling and providing education to the patient, family, or caregiver  Documenting information in the medical record  Interpreting results and sharing that information with the patient, family or caregiver  Ordering medicines, tests, or procedures  Communicating with other healthcare professionals  Care coordination           I, Lamar KANDICE Riches, MD, have personally performed the history, physical exam and medical decision making; and confirmed the accuracy of the information in the transcribed note.    Date: 10/08/2023 Time: 10:12 AM               [1]   Patient Active Problem List  Diagnosis    Anterior cruciate ligament disruption, left, initial encounter    Complex tear of medial meniscus of left knee, initial encounter    Tear of lateral cartilage or meniscus of knee, current   [2]   Past Medical History:  Diagnosis Date    Asthma     exercise induced as a child, no recent issues. no inhaler in years    Difficulty walking     limps    Headache     Post-operative nausea and vomiting     Grandmother   [3]   Social History  Tobacco Use    Smoking status: Never    Smokeless tobacco: Never   Vaping Use    Vaping status: Never Used   Substance Use Topics    Alcohol use: No    Drug use: No   [4]   Family History  Problem Relation Name Age of Onset    No known problems Mother      No known problems Father     [5]   Past Surgical History:  Procedure Laterality Date    ARTHROSCOPIC ASSISTED, KNEE, ANTERIOR CRUCIATE LIGAMENT (ACL) RECONSTRUCTION, ALLOGRAFT Left 09/30/2017    Procedure: ARTHROSCOPIC KNEE, ACL RECONSTRUCTION;  Surgeon: Riches Lamar KANDICE, MD;  Location: [facility];  Service: Orthopedics;  Laterality: Left;   LEFT KNEE ARTHROSCOPY, ACL RECONSTRUCTION W/BONE-PATTELLAR-BONE AUTOGRAFT; PARTIAL LATERAL MENISECTOMY    tonsillectomy and adnoids removed  2008   [6] No Known Allergies

## 2023-10-19 NOTE — Progress Notes (Signed)
 [facility] Visit Note      Rebekah Barrera is a 23 y.o. female who presents with sore throat.     History obtained from patient.     Assessment & Plan:      Assessment & Plan  Acute pharyngitis, unspecified etiology  May take Acetaminophen  or Ibuprofen as needed as directed for pain and fever  Get plenty of rest. Drink plenty of fluids, at least 8 large glasses of fluid a day. Good fluid choices are water, fruit juices high in Vitamin C, tea, gelatin, or broths and soups. These help to keep mucus thin and ease congestion. Use salt water gargle, cough drops or throat sprays to relieve throat pain. Honey with tea can also help reduce sore throat and is a good cough suppressant. Mix  to  teaspoon of salt in 1 cup of warm water for a salt water gargle solution. Use saline nose spray or OTC flonase to help ease congestion. If fever, wheezing, SOB occur go to the nearest UC for further evaluation    *Rest well, stay hydrated. May eat yogurt/take probiotics to boost a healthy immune system.      *Take zinc/elderberry to promote immune system functioning.       Advised to seek emergent care if have any trouble breathing, shortness of breath, increased fevers greater than 100 F, coughing up blood, chest discomfort, palpitations, drooling, numbness/tingling.     Follow up with PCP if symptoms fail to improve in 24-48 hours or other symptoms develop.   Pt states understanding and agrees with plan of care.   Follow up care instructions were provided to the Patient. All questions answered. Patient verbalized understanding of plan of care today.   Orders:  .  ibuprofen (ADVIL) 200 MG tablet; Take 1 tablet (200 mg total) by mouth every 6 (six) hours as needed for fever for up to 7 days    Voice strain    Orders:  .  ibuprofen (ADVIL) 200 MG tablet; Take 1 tablet (200 mg total) by mouth every 6 (six) hours as needed for fever for up to 7 days  RICE" therapy (Rest-Ice-Compress-Elevate) recommended:     Rest: Rest the injured  joint - including using crutches to walk, if indicated - until the pain and swelling are gone, and the joint is strong.  To avoid becoming deconditioned, shift your exercise routine to activities that use other muscles.  Ice: Apply ice to the painful, swollen area for the first several days.  Do not apply ice directly to the skin; instead, protect the skin by placing a cloth or towel between the skin and the ice pack. Avoid using heat for the first 72 hours, as it can increase swelling.    Elevate: head of bed elevated  Elevated blood-pressure reading without diagnosis of hypertension  Blood pressure is newly identified.  Dietary sodium restriction.  Weight loss.  Regular aerobic exercise.  Blood pressure will be reassessed in 2 weeks with pcp.       Blood pressure elevated at today's visit; no history of hypertension. Encouraged patient to return to [facility] or follow-up with PCP for re-evaluation of elevated BP.      No follow-ups on file.    Subjective       Pt presents after hitting herself in the throat with her aerial hoop- it happened last night and he voice is raspy and she is having mild left sided throat pain when she swallows    Mouth or throat complaint  Duration of current symptoms:  1 days  Onset quality:  Suddenly  Associated symptoms: sore throat and other    Associated symptoms: no abdominal pain, no myalgias, no chills, no congestion, no cough, no appetite change, no dental problem, no diarrhea, no drooling, no ear pain, no facial swelling, no fatigue, no fever, no headaches, no mouth sores, no nausea, no neck stiffness, no rash, no rhinorrhea, no diaphoresis, no swollen glands and no vomiting    Red flag features: no muffled or 'hot potato' voice, no hoarseness, no drooling, no stridor, no respiratory distress and no Sniffing or tripod positions    Treatments tried:  Gargling  Response to treatment:  No change  Special considerations:  None apply       Allergies   Allergen Reactions    . Grass Pollen-June Grass Standard        Review of Systems   Constitutional:  Negative for appetite change, chills, diaphoresis, fatigue and fever.   HENT:  Positive for sore throat. Negative for congestion, dental problem, drooling, ear pain, facial swelling, mouth sores and rhinorrhea.    Respiratory:  Negative for cough.    Gastrointestinal:  Negative for abdominal pain, diarrhea, nausea and vomiting.   Musculoskeletal:  Negative for myalgias and neck stiffness.   Skin:  Negative for rash.   Neurological:  Negative for headaches.        Objective       Vital Signs:  BP 130/84   Pulse 72   Temp 98 F (36.7 C)   Resp 18   SpO2 98%      Physical Exam  Vitals reviewed.   Constitutional:       Appearance: Normal appearance.   HENT:      Head: Normocephalic.      Nose: Congestion and rhinorrhea present. No septal deviation, signs of injury or laceration.      Right Turbinates: Swollen.      Left Turbinates: Swollen.      Right Sinus: No maxillary sinus tenderness or frontal sinus tenderness.      Left Sinus: No maxillary sinus tenderness or frontal sinus tenderness.      Mouth/Throat:      Lips: Pink.      Mouth: Mucous membranes are moist.      Pharynx: Uvula midline. Posterior oropharyngeal erythema present. No pharyngeal swelling, oropharyngeal exudate or uvula swelling.      Tonsils: No tonsillar exudate or tonsillar abscesses. 0 on the right. 1+ on the left.   Eyes:      Pupils: Pupils are equal, round, and reactive to light.   Neck:      Thyroid: No thyroid mass or thyromegaly.      Vascular: Normal carotid pulses. No carotid bruit, hepatojugular reflux or JVD.      Trachea: Trachea normal.   Cardiovascular:      Rate and Rhythm: Normal rate and regular rhythm.      Pulses: Normal pulses.      Heart sounds: Normal heart sounds.   Pulmonary:      Effort: Pulmonary effort is normal.      Breath sounds: Normal breath sounds.   Musculoskeletal:      Cervical back: Full passive range of motion without pain,  normal range of motion and neck supple.   Lymphadenopathy:      Cervical: Cervical adenopathy present.      Left cervical: Superficial cervical adenopathy present.   Skin:     General: Skin is  warm and dry.      Capillary Refill: Capillary refill takes less than 2 seconds.      Findings: No rash.   Neurological:      General: No focal deficit present.      Mental Status: She is alert.

## 2023-10-22 NOTE — Telephone Encounter (Signed)
 Call [employer]  Call being placed to:: Patient         Call Reason (PT)  What is the reason for the call?: Acute Visit         ACUTE VISIT  Patient Reached?: No  Voicemail left?: Yes  Provide VM Details:: lvm for pt informing the reason for the call and to follow up with urgentcare/pcp if symptoms are persiting.

## 2023-11-14 ENCOUNTER — Emergency Department
Admission: EM | Admit: 2023-11-14 | Discharge: 2023-11-14 | Disposition: A | Discharge status: Home / Self Care | Attending: Residents | Admitting: Residents

## 2023-11-14 DIAGNOSIS — F10920 Alcohol use, unspecified with intoxication, uncomplicated: Secondary | ICD-10-CM | POA: Insufficient documentation

## 2023-11-14 DIAGNOSIS — R1110 Vomiting, unspecified: Secondary | ICD-10-CM | POA: Insufficient documentation

## 2023-11-14 MED ORDER — ONDANSETRON 4 MG PO TBDP
4.0000 mg | ORAL_TABLET | Freq: Once | ORAL | Status: AC
Start: 2023-11-14 — End: 2023-11-14
  Administered 2023-11-14: 4 mg via ORAL
  Filled 2023-11-14: qty 1

## 2023-11-14 NOTE — ED Triage Notes (Signed)
 Rebekah Barrera is a 23 y.o. female presenting with etoh intoxication and vomiting. Patient was in an uber on her way home and began vomiting, admits to etoh.

## 2023-11-14 NOTE — ED Provider Notes (Addendum)
 History     Chief Complaint   Patient presents with    Alcohol Intoxication     HPI 23 year old past medical history of recent knee injury, asthma who presents today for intoxication.  Patient endorses alcohol use and was in an [place] on her way home when she started vomiting.  Other than alcohol use she denies any other associated symptoms other than just continued vomiting.    Medical History[1]    Past Surgical History[2]    Family History[3]    Social  Social History[4]    .     Allergies[5]    Home Medications               aspirin-acetaminophen -caffeine (EXCEDRIN MIGRAINE) 250-250-65 MG per tablet     Take 1 tablet by mouth every 6 (six) hours as needed     cetirizine (ZyrTEC) 10 MG chewable tablet     Chew 1 tablet (10 mg) by mouth daily     diclofenac  (VOLTAREN ) 75 MG EC tablet (Expired)     Take 1 tablet (75 mg) by mouth 2 (two) times daily             Review of Systems   All other systems reviewed and are negative.      Physical Exam    BP: 118/60, Heart Rate: 81, Temp: 97.7 F (36.5 C), Resp Rate: 16, SpO2: 97 %    Physical Exam  Vitals and nursing note reviewed.   Constitutional:       General: She is not in acute distress.     Appearance: She is not ill-appearing or toxic-appearing.      Comments: Covered in emesis but easily arousable and answers questions   HENT:      Head: Normocephalic and atraumatic.      Nose: Nose normal.      Mouth/Throat:      Mouth: Mucous membranes are moist.     Eyes:      Extraocular Movements: Extraocular movements intact.      Conjunctiva/sclera: Conjunctivae normal.      Pupils: Pupils are equal, round, and reactive to light.       Cardiovascular:      Rate and Rhythm: Normal rate.      Pulses: Normal pulses.   Pulmonary:      Effort: Pulmonary effort is normal.      Breath sounds: Normal breath sounds.   Abdominal:      General: There is no distension.      Palpations: Abdomen is soft.      Tenderness: There is no abdominal tenderness. There is no guarding or  rebound.     Musculoskeletal:         General: Normal range of motion.      Cervical back: Normal range of motion and neck supple.     Skin:     General: Skin is warm.      Capillary Refill: Capillary refill takes less than 2 seconds.     Neurological:      Mental Status: She is alert.      Comments: Slurring words but answers questions and eyes open to verbal stimuli.  Follows commands.         MDM and ED Course     ED Medication Orders (From admission, onward)      Start Ordered     Status Ordering Provider    11/14/23 0207 11/14/23 0206  ondansetron  (ZOFRAN -ODT) disintegrating tablet 4 mg  Once        Route: Oral  Ordered Dose: 4 mg       Last MAR action: Given JULIEN BARTER C               Medical Decision Making  Risk  Prescription drug management.    23 year old presents tonight for intoxication.  No external signs of trauma and was reportedly with Gisele when she started having repeated vomiting.  Will allow to metabolize here      ED Course as of 11/14/23 0606   [place] Nov 14, 2023   9546 The patient is now ambulatory and tolerating p.o. and her mother is here to pick her up [AS]      ED Course User Index  [AS] JULIEN BARTER BROCKS, MD             Procedures    Clinical Impression & Disposition     Clinical Impression  Final diagnoses:   Alcoholic intoxication without complication        ED Disposition       ED Disposition   Discharge    Condition   --    Date/Time   Sun Nov 14, 2023  4:53 AM    Comment   Rea CHRISTELLA Man discharge to home/self care.    Condition at disposition: Stable                  Discharge Medication List as of 11/14/2023  4:53 AM                        Rohen Kimes, BARTER BROCKS, MD  11/14/23 0236       [1]   Past Medical History:  Diagnosis Date    Asthma     exercise induced as a child, no recent issues. no inhaler in years    Difficulty walking     limps    Headache     Post-operative nausea and vomiting     Grandmother   [2]   Past Surgical History:  Procedure Laterality Date    ARTHROSCOPIC  ASSISTED, KNEE, ANTERIOR CRUCIATE LIGAMENT (ACL) RECONSTRUCTION, ALLOGRAFT Left 09/30/2017    Procedure: ARTHROSCOPIC KNEE, ACL RECONSTRUCTION;  Surgeon: Verdis Lamar MATSU, MD;  Location: [facility];  Service: Orthopedics;  Laterality: Left;  LEFT KNEE ARTHROSCOPY, ACL RECONSTRUCTION W/BONE-PATTELLAR-BONE AUTOGRAFT; PARTIAL LATERAL MENISECTOMY    tonsillectomy and adnoids removed  2008   [3]   Family History  Problem Relation Name Age of Onset    No known problems Mother      No known problems Father     [4]   Social History  Tobacco Use    Smoking status: Never    Smokeless tobacco: Never   Vaping Use    Vaping status: Never Used   Substance Use Topics    Alcohol use: No    Drug use: No   [5] No Known Allergies       JULIEN BARTER BROCKS, MD  11/14/23 [phone]

## 2023-11-14 NOTE — ED Notes (Signed)
 Bed: GR12  Expected date:   Expected time:   Means of arrival:   Comments:  M 206

## 2023-12-15 ENCOUNTER — Ambulatory Visit (INDEPENDENT_AMBULATORY_CARE_PROVIDER_SITE_OTHER): Admitting: Sports Medicine

## 2023-12-15 ENCOUNTER — Encounter (INDEPENDENT_AMBULATORY_CARE_PROVIDER_SITE_OTHER): Payer: Self-pay | Admitting: Sports Medicine

## 2023-12-15 VITALS — BP 111/65 | HR 47 | Resp 18 | Ht 69.0 in | Wt 200.0 lb

## 2023-12-15 DIAGNOSIS — G8929 Other chronic pain: Secondary | ICD-10-CM

## 2023-12-15 DIAGNOSIS — M25561 Pain in right knee: Secondary | ICD-10-CM

## 2023-12-15 NOTE — Progress Notes (Signed)
 [facility]    Provider: Lamar KANDICE Riches, MD  Date of Exam:  12/15/2023   Patient:  Rebekah Barrera  DOB:  2001-04-09    AGE:  23 y.o.  MR#:  [id number]     Diagnosis: s/p right knee arthroscopy with partial lateral menisectomy, chondroplasty and microfracture of lateral femoral condyle defect (DOS 03/09/23)    HPI: Tylyn returns, 9 months status post right knee surgery as described above. She has been compliant with all post-operative instructions to date.  She has returned to dance/theatre, and reports that she has been performing without restriction, but does notice pain laterally with impact loading/jumping.  She has a chondral defect and I think this is what limits her I do want to get a repeat MRI at this time to further evaluate it.  She has attended formal physical therapy as previously prescribed and been did discharged. Malachi is here today for her regularly scheduled post-operative visit.  She has not returned to volleyball but she has returned to dancing and doing fairly well with that.  She does get occasional pain.  Of note she is leaving to study abroad in [place] for 3 months.  I will see her back for the MRI if she can get back before she leaves if not she will follow-up with me when she comes back from [place].    For other past medical history, social history, family history and past surgical history please see them listed below.    Problem List: Problem List[1]    Past Medical History:  Medical History[2]    Social History: Social History[3]    Family History: Family History[4]    Past Surgical History:  Past Surgical History[5]    Medications:  Scheduled Meds:  Continuous Infusions:  PRN Meds:.     Allergies:  Allergies[6]    ROS:  Constitutional: No fatigue, fever, weight loss, or weight gain.   Ears, Nose, Mouth & Throat: No sore throat or hearing loss.   Cardiovascular: No chest pain, blood clots, or leg cramps.   Respiratory: No shortness of breath, cough, or difficulty breathing.    Gastrointestinal: No nausea, vomiting, diarrhea, or loss of appetite.   Genitourinary: No polyuria or kidney disease.   Musculoskeletal: No joint aches, muscle weakness or swelling of joints/body parts other than that mentioned above.  Integumentary: No finger nail changes or skin dryness.   Neurological: No numbness, burning discomfort, or headaches.   Psychiatric: No depression or anxiety.   Endocrine: No increased thirst, change in appetite or thyroid disease.   Hematologic/Lymphatic: No easy bruising or anemia.     Exam:   Right knee Exam:  Incisions are well-healed,   No pain with passive flexion   Calf soft, nttp  Negative Homan's  No effusion  No pain with passive flexion   Range of motion -10-0-135 without pain  No significant effusion.  No tenderness to palpation.   Straight leg raise is intact.  Mild quadriceps atrophy.  Lachman is 1A  Negative pivot shift.  Stable anterior drawer.  Stable varus and valgus at 0 and 30 degrees  No other areas of tenderness.  DTR and Pathological Reflexes Intact  No lymphadenopathy  Sensation Intact to light touch all distributions of leg and foot  DF, PF, EHL motor intact  Foot Perfused with CR <2 sec  DP/PT pulse 2+    Vitals: BP 111/65 (BP Site: Left arm, Patient Position: Sitting, Cuff Size: Large)   Pulse (!) 47  Resp 18   Ht 1.753 m (5' 9)   Wt 90.7 kg (200 lb)   LMP  (LMP Unknown)   BMI 29.53 kg/m     General: Addley was awake, alert, oriented, easily engaged, displayed logical thinking with clear speech and was neat in appearance. Her general appearance was normal, well-developed and well-nourished.    Gait: The patient demonstrated non antalgic gait with use of crutches    Studies: No new studies obtained on today's visit.     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 She was instructed on the dos and don'ts regarding protection of the surgical procedure, as well as appropriate activity levels going forward. I  do think chondral injury is probably giving her some of the discomfort I think will improve with strengthening.  I do want to get an MRI to further evaluate the chondral defect.  I will see her back for the MRI if she can get back before she leaves if not she will follow-up with me when she comes back from [place]. Dessie and all the patient's questions were answered appropriately and completely. Giselle understands the plan.     Based on the below 2023 Evaluation and Management Time Requirements, the appropriate level of service is 99213 based on the 22 minutes spent face-to-face with the patient along with the non-face-to-face time spent by me performing the following highlighted activities:      Preparing for the visit (such as reviewing tests)  Getting and/or reviewing a history that was separately obtained  Performing the exam  Counseling and providing education to the patient, family, or caregiver  Documenting information in the medical record  Interpreting results and sharing that information with the patient, family or caregiver  Ordering medicines, tests, or procedures  Communicating with other healthcare professionals  Care coordination           I, Lamar KANDICE Riches, MD, have personally performed the history, physical exam and medical decision making; and confirmed the accuracy of the information in the transcribed note.    Date: 12/15/2023 Time: 7:35 AM               [1]   Patient Active Problem List  Diagnosis    Anterior cruciate ligament disruption, left, initial encounter    Complex tear of medial meniscus of left knee, initial encounter    Tear of lateral cartilage or meniscus of knee, current   [2]   Past Medical History:  Diagnosis Date    Asthma     exercise induced as a child, no recent issues. no inhaler in years    Difficulty walking     limps    Headache     Post-operative nausea and vomiting     Grandmother   [3]   Social History  Tobacco Use    Smoking status: Never    Smokeless tobacco: Never    Vaping Use    Vaping status: Never Used   Substance Use Topics    Alcohol use: No    Drug use: No   [4]   Family History  Problem Relation Name Age of Onset    No known problems Mother      No known problems Father     [5]   Past Surgical History:  Procedure Laterality Date    ARTHROSCOPIC ASSISTED, KNEE, ANTERIOR CRUCIATE LIGAMENT (ACL) RECONSTRUCTION, ALLOGRAFT Left 09/30/2017    Procedure: ARTHROSCOPIC KNEE, ACL RECONSTRUCTION;  Surgeon: Riches Lamar KANDICE, MD;  [facility];  Service: Orthopedics;  Laterality: Left;  LEFT KNEE ARTHROSCOPY, ACL RECONSTRUCTION W/BONE-PATTELLAR-BONE AUTOGRAFT; PARTIAL LATERAL MENISECTOMY    tonsillectomy and adnoids removed  2008   [6] No Known Allergies

## 2023-12-25 ENCOUNTER — Other Ambulatory Visit: Payer: Self-pay | Admitting: Sports Medicine

## 2023-12-25 ENCOUNTER — Ambulatory Visit
Admission: RE | Admit: 2023-12-25 | Discharge: 2023-12-25 | Disposition: A | Discharge status: Home / Self Care | Source: Ambulatory Visit | Attending: Sports Medicine | Admitting: Sports Medicine

## 2023-12-25 DIAGNOSIS — M25561 Pain in right knee: Secondary | ICD-10-CM | POA: Insufficient documentation

## 2023-12-25 DIAGNOSIS — G8929 Other chronic pain: Secondary | ICD-10-CM | POA: Insufficient documentation

## 2023-12-29 ENCOUNTER — Ambulatory Visit (INDEPENDENT_AMBULATORY_CARE_PROVIDER_SITE_OTHER): Admitting: Sports Medicine

## 2023-12-29 ENCOUNTER — Encounter (INDEPENDENT_AMBULATORY_CARE_PROVIDER_SITE_OTHER): Payer: Self-pay | Admitting: Sports Medicine

## 2023-12-29 VITALS — BP 123/79 | HR 76

## 2023-12-29 DIAGNOSIS — Z9889 Other specified postprocedural states: Secondary | ICD-10-CM

## 2023-12-29 MED ORDER — MELOXICAM 15 MG PO TABS
15.0000 mg | ORAL_TABLET | Freq: Every day | ORAL | 1 refills | Status: AC
Start: 2023-12-29 — End: 2024-02-27

## 2023-12-29 NOTE — Progress Notes (Signed)
 [facility]    Provider: Lamar KANDICE Riches, MD  Date of Exam:  12/29/2023   Patient:  Rebekah Barrera  DOB:  16-Mar-2001    AGE:  23 y.o.  MR#:  [id number]     Diagnosis: s/p right knee arthroscopy with partial lateral menisectomy, chondroplasty and microfracture of lateral femoral condyle defect (DOS 03/09/23)    HPI: Rebekah Barrera returns, 9 months status post right knee surgery as described above. She has been compliant with all post-operative instructions to date.  She has returned to dance/theatre, and reports that she has been performing without restriction, but does notice pain laterally with impact loading/jumping.  She has a chondral defect and I think this is what limits her I do want to get a repeat MRI at this time to further evaluate it.  She has attended formal physical therapy as previously prescribed and been did discharged. Rebekah Barrera is here today for her regularly scheduled post-operative visit.  Last time I saw her we sent her for an MRI.  She comes today for review of her MRI.  Of note she is leaving for [place] tomorrow    For other past medical history, social history, family history and past surgical history please see them listed below.    Problem List: Problem List[1]    Past Medical History:  Medical History[2]    Social History: Social History[3]    Family History: Family History[4]    Past Surgical History:  Past Surgical History[5]    Medications:  Scheduled Meds:  Continuous Infusions:  PRN Meds:.     Allergies:  Allergies[6]    ROS:  Constitutional: No fatigue, fever, weight loss, or weight gain.   Ears, Nose, Mouth & Throat: No sore throat or hearing loss.   Cardiovascular: No chest pain, blood clots, or leg cramps.   Respiratory: No shortness of breath, cough, or difficulty breathing.   Gastrointestinal: No nausea, vomiting, diarrhea, or loss of appetite.   Genitourinary: No polyuria or kidney disease.   Musculoskeletal: No joint aches, muscle weakness or swelling of joints/body parts other  than that mentioned above.  Integumentary: No finger nail changes or skin dryness.   Neurological: No numbness, burning discomfort, or headaches.   Psychiatric: No depression or anxiety.   Endocrine: No increased thirst, change in appetite or thyroid disease.   Hematologic/Lymphatic: No easy bruising or anemia.     Exam:   Right knee Exam:  Incisions are well-healed,   No pain with passive flexion   Calf soft, nttp  Negative Homan's  No effusion  No pain with passive flexion   Range of motion -10-0-135 without pain  No significant effusion.  No tenderness to palpation.   Straight leg raise is intact.  Mild quadriceps atrophy.  Lachman is 1A  Negative pivot shift.  Stable anterior drawer.  Stable varus and valgus at 0 and 30 degrees  No other areas of tenderness.  DTR and Pathological Reflexes Intact  No lymphadenopathy  Sensation Intact to light touch all distributions of leg and foot  DF, PF, EHL motor intact  Foot Perfused with CR <2 sec  DP/PT pulse 2+    Vitals: BP 123/79   Pulse 76   LMP  (LMP Unknown)     General: Rebekah Barrera was awake, alert, oriented, easily engaged, displayed logical thinking with clear speech and was neat in appearance. Her general appearance was normal, well-developed and well-nourished.    Gait: The patient demonstrated non antalgic gait with use of crutches  Studies:   MRI from [facility] radiology available for review today reveals  IMPRESSION:      1.  Attenuation of the lateral meniscus following meniscal debridement  with subtle residual/recurrent tearing at the lateral meniscal body.  2.  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 These  findings may represent sequela of microfracture procedure. Correlate with  patient's surgical history.  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 She understands.  If not she will follow-up with me when she comes back from [place]. Rebekah Barrera and all the patient's questions were answered appropriately and completely. Rebekah Barrera understands the plan.   It is my opinion Rebekah Barrera would benefit from hyaluronic acid injections for their osteoarthritic knee pain. Our office will initiate the insurance prior authorization process for viscosupplement injections on behalf of Rebekah Barrera.     Viscosupplement product : Insurance preferred brand    unilateral (1 joint): right    Diagnosis codes: M17.11 Unilateral primary osteoarthritis, right knee    Initiation or continuation of viscosupplementation therapy: Initiation of therapy (first visco)    Previous injection: n/a    Office that injections will be shipped to and patient will receive them: [facility],  [address] [place], [place], [address]    Has radiological evidence showing osteoarthritis consistent with Shari Satterfield Grade 3 or worse: yes Joint space narrowing  Has pain with activities of daily living, including prolonged standing, ambulation, and/or transitions from sitting to standing: yes  Has trailed and/or completed physical therapy without relief: yes  Has trialed cardiovascular and/or home exercises without relief: yes  Has been educated regarding osteoarthritis and attempted weight loss, if applicable: yes  Has trialed and failed taping, bracing, and/or insoles: yes  Has tried prescriptive and/or over the counter NSAIDs: Meloxicam     Has had an intra-articular steroid injection(s) to the affected knee(s) with less than 8 weeks of relief in the past 3 months OR has a history of intolerance or allergic reaction: yes    Patient Insurance:  Payor: OUT OF STATE BLUE CROSS / Plan: BCBS OUT OF STATE PPO / Product Type: BCBS /                 [1]   Patient Active  Problem List  Diagnosis    Anterior cruciate ligament disruption, left, initial encounter    Complex tear of medial meniscus of left knee, initial encounter    Tear of lateral cartilage or meniscus of knee, current   [2]   Past Medical History:  Diagnosis Date    Asthma     exercise induced as a child, no recent issues. no inhaler in years    Difficulty walking     limps    Headache     Post-operative nausea and vomiting     Grandmother   [3]   Social History  Tobacco Use    Smoking status: Never    Smokeless tobacco: Never   Vaping Use    Vaping status: Never Used   Substance Use Topics    Alcohol use: No    Drug use: No   [4]   Family History  Problem Relation Name Age of  Onset    No known problems Mother      No known problems Father     [5]   Past Surgical History:  Procedure Laterality Date    ARTHROSCOPIC ASSISTED, KNEE, ANTERIOR CRUCIATE LIGAMENT (ACL) RECONSTRUCTION, ALLOGRAFT Left 09/30/2017    Procedure: ARTHROSCOPIC KNEE, ACL RECONSTRUCTION;  Surgeon: Verdis Rebekah MATSU, MD;  Location: [facility];  Service: Orthopedics;  Laterality: Left;  LEFT KNEE ARTHROSCOPY, ACL RECONSTRUCTION W/BONE-PATTELLAR-BONE AUTOGRAFT; PARTIAL LATERAL MENISECTOMY    tonsillectomy and adnoids removed  2008   [6] No Known Allergies

## 2024-05-01 ENCOUNTER — Other Ambulatory Visit (INDEPENDENT_AMBULATORY_CARE_PROVIDER_SITE_OTHER): Payer: Self-pay | Admitting: Sports Medicine

## 2024-05-01 ENCOUNTER — Telehealth (INDEPENDENT_AMBULATORY_CARE_PROVIDER_SITE_OTHER): Payer: Self-pay

## 2024-05-01 DIAGNOSIS — M1711 Unilateral primary osteoarthritis, right knee: Secondary | ICD-10-CM

## 2024-05-01 MED ORDER — EUFLEXXA 20 MG/2ML IX SOSY: PREFILLED_SYRINGE | INTRA_ARTICULAR | 0 refills | Status: AC

## 2024-05-01 NOTE — Telephone Encounter (Signed)
 Viscosupplement product : Insurance preferred brand     unilateral (1 joint): right     Diagnosis codes: M17.11 Unilateral primary osteoarthritis, right knee     Initiation or continuation of viscosupplementation therapy: Initiation of therapy (first visco)     Previous injection: n/a     Office that injections will be shipped to and patient will receive them: [facility],  [address] [place], [place], [address]     Has radiological evidence showing osteoarthritis consistent with Shari Satterfield Grade 3 or worse: yes Joint space narrowing  Has pain with activities of daily living, including prolonged standing, ambulation, and/or transitions from sitting to standing: yes  Has trailed and/or completed physical therapy without relief: yes  Has trialed cardiovascular and/or home exercises without relief: yes  Has been educated regarding osteoarthritis and attempted weight loss, if applicable: yes  Has trialed and failed taping, bracing, and/or insoles: yes  Has tried prescriptive and/or over the counter NSAIDs: Meloxicam      Has had an intra-articular steroid injection(s) to the affected knee(s) with less than 8 weeks of relief in the past 3 months OR has a history of intolerance or allergic reaction: yes     Patient Insurance:  Payor: OUT OF STATE BLUE CROSS / Plan: BCBS OUT OF STATE PPO / Product Type: BCBS /      CMM Key: [id number]        CMM response said the medication was excluded from coverage and to call [facility] directly at [phone]. I called and spoke with Buren who said none of the HA medications were covered by the patient's pharmacy plan.     I called [employer] customer support and spoke with representative Kat. She stated the medication was 100% covered and that no auth was needed. Ref # [id number]. Anais, triage RN, was on the call to confirm. Odean confirmed that [employer] was in network. Prescription sent and receipt verified by the pharmacy. Will wait to hear back from the  pharmacy.

## 2024-05-02 ENCOUNTER — Encounter (INDEPENDENT_AMBULATORY_CARE_PROVIDER_SITE_OTHER): Payer: Self-pay

## 2024-05-02 NOTE — Telephone Encounter (Signed)
 I called [facility] pharmacy to confirm they can fill the HA prescription. I spoke with Beverley, pharmacist, who said they only process the injections through pharmacy benefits and not medical and verified the information with her supervisor. Letter of denial was sent to the patient.
